# Patient Record
Sex: Male | Born: 1960 | Race: White | Hispanic: No | Marital: Married | Smoking: Never smoker
Health system: Southern US, Community
[De-identification: ages and names within clinical notes are randomized; demographics above are authoritative.]

---

## 2008-12-23 ENCOUNTER — Ambulatory Visit: Payer: Self-pay | Admitting: Diagnostic Radiology

## 2008-12-23 ENCOUNTER — Ambulatory Visit (HOSPITAL_BASED_OUTPATIENT_CLINIC_OR_DEPARTMENT_OTHER): Admission: RE | Admit: 2008-12-23 | Discharge: 2008-12-23 | Payer: Self-pay | Admitting: Family Medicine

## 2009-09-17 IMAGING — US US ABDOMEN COMPLETE
1 series · 14 of 14 positions shown · non-contrast
Comparison: None

CLINICAL DATA: Elevated bilirubin levels.

COMPLETE ABDOMINAL ULTRASOUND

[Series 1: us abdomen complete · 0.35mm/px · 14 of 14 slices shown]
[im 1/14]
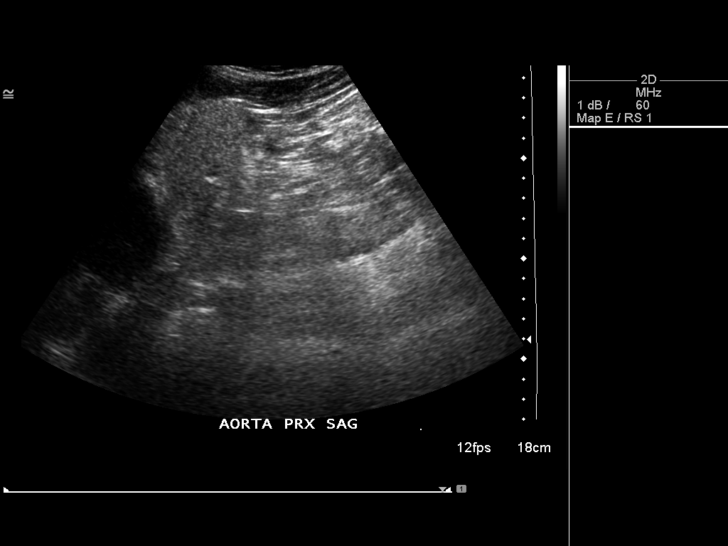
[im 2/14]
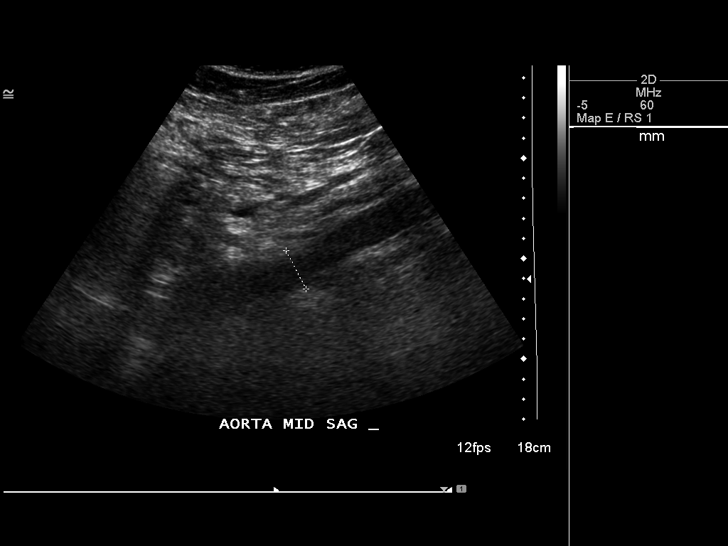
[im 3/14]
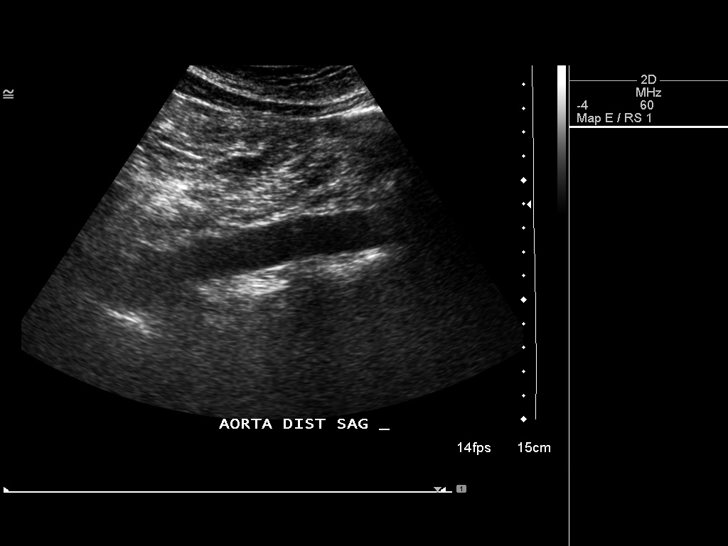
[im 4/14]
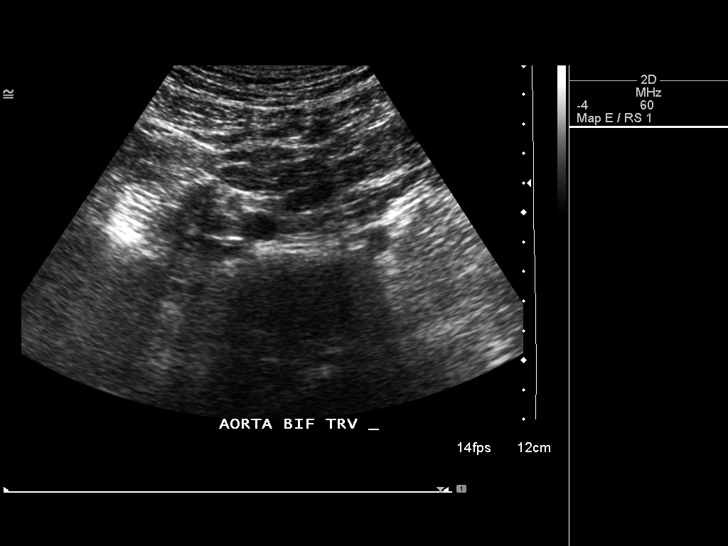
[im 5/14]
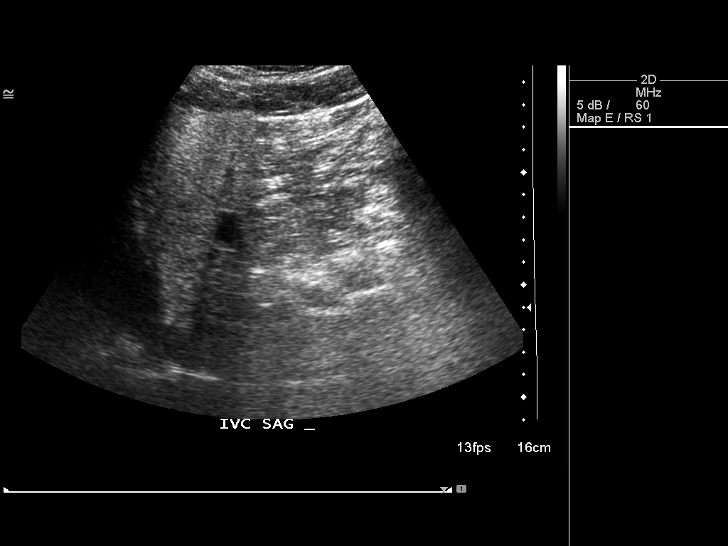
[im 6/14]
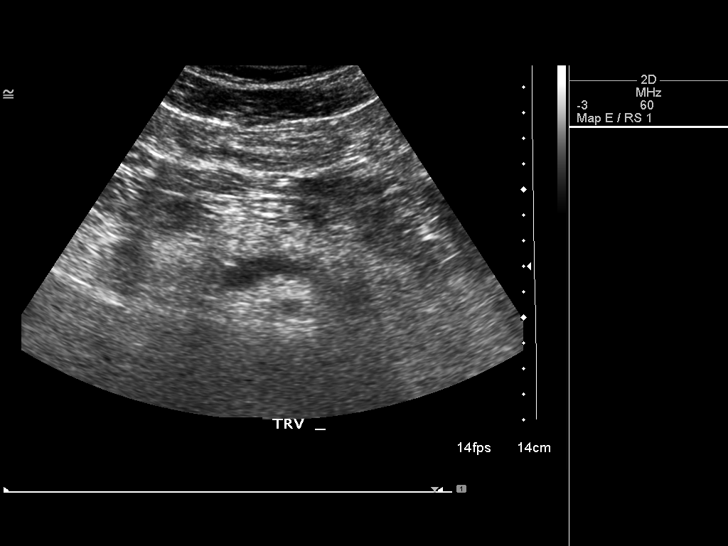
[im 7/14]
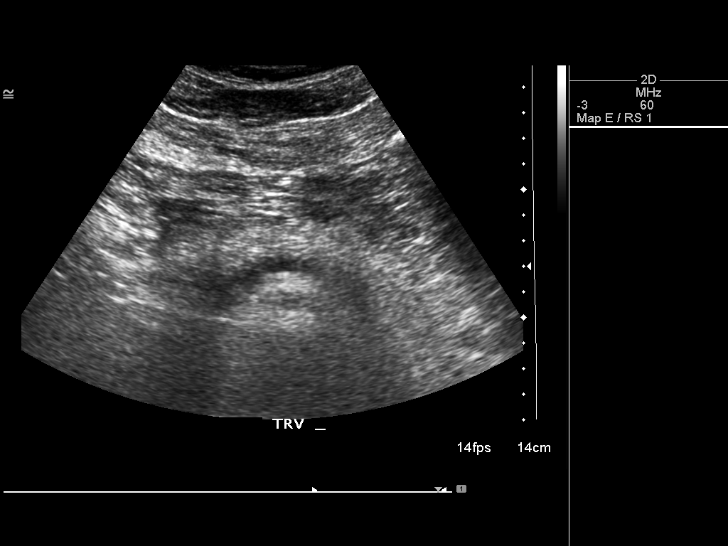
[im 8/14]
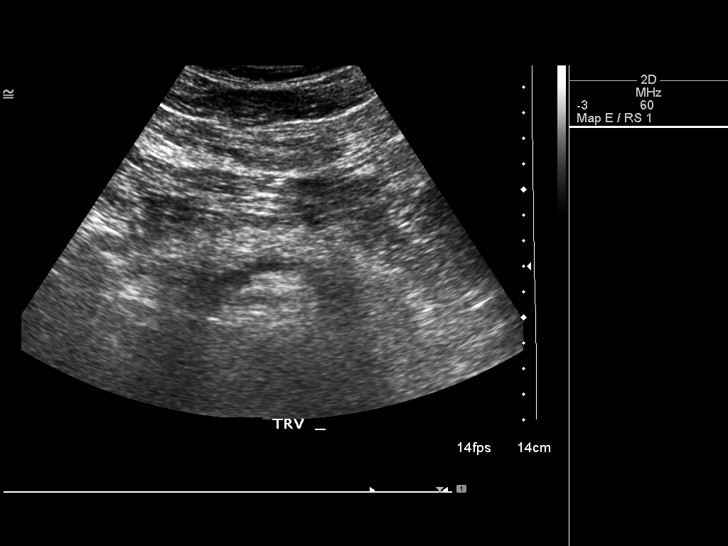
[im 9/14]
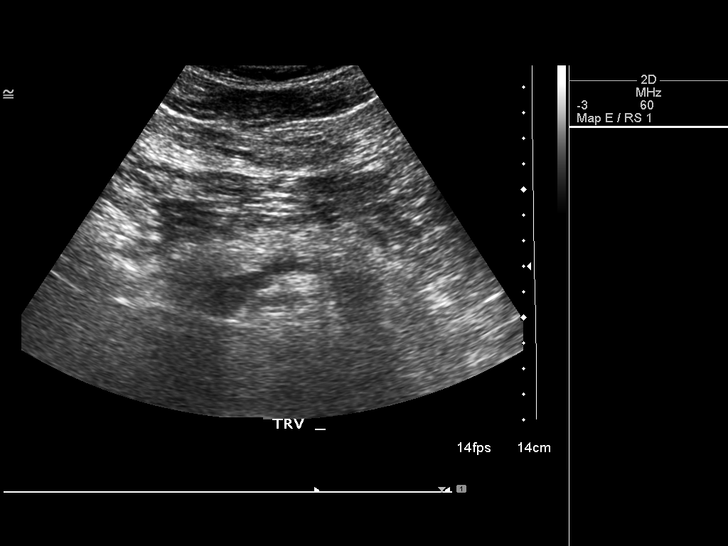
[im 10/14]
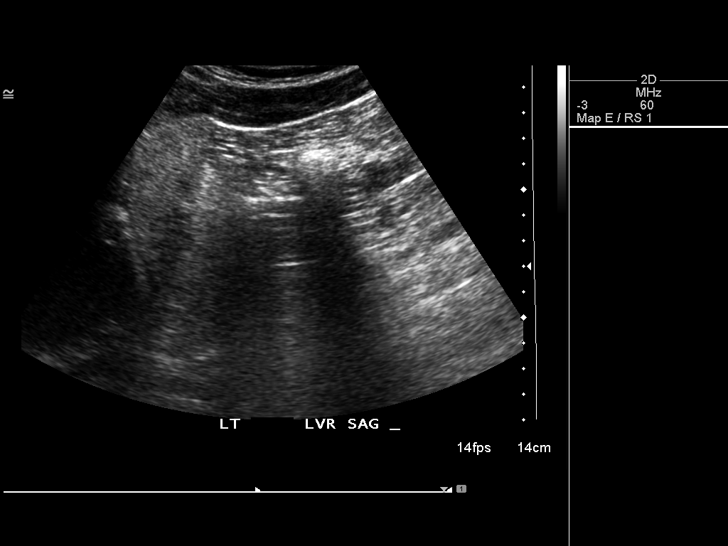
[im 11/14]
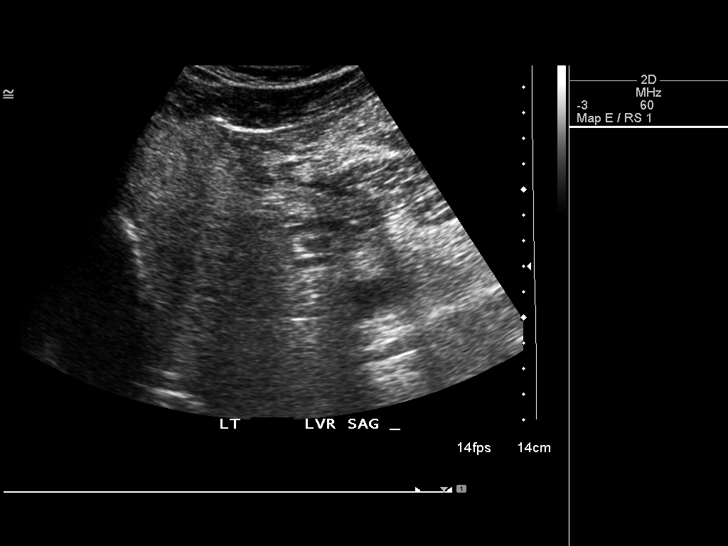
[im 12/14]
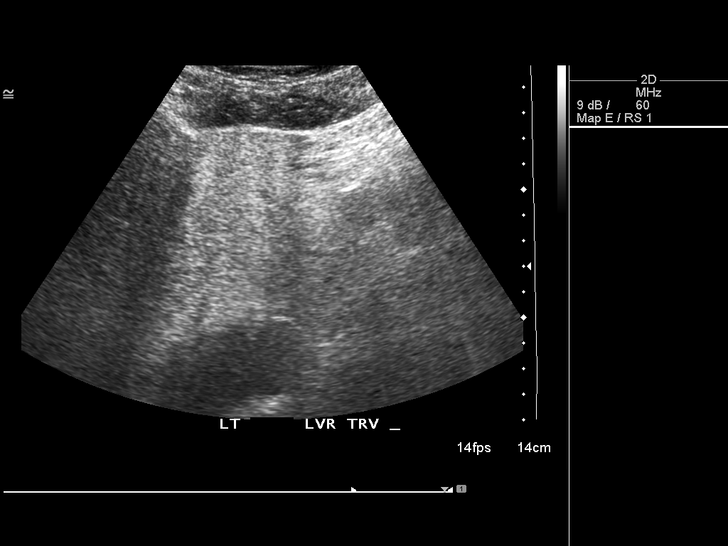
[im 13/14]
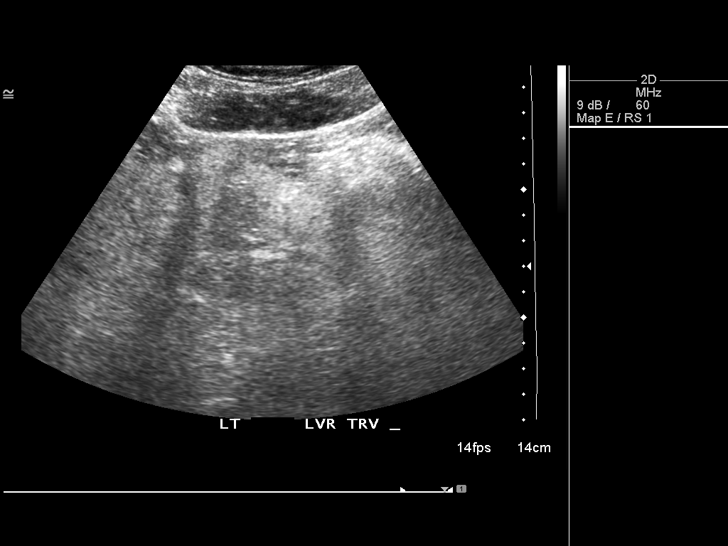
[im 14/14]
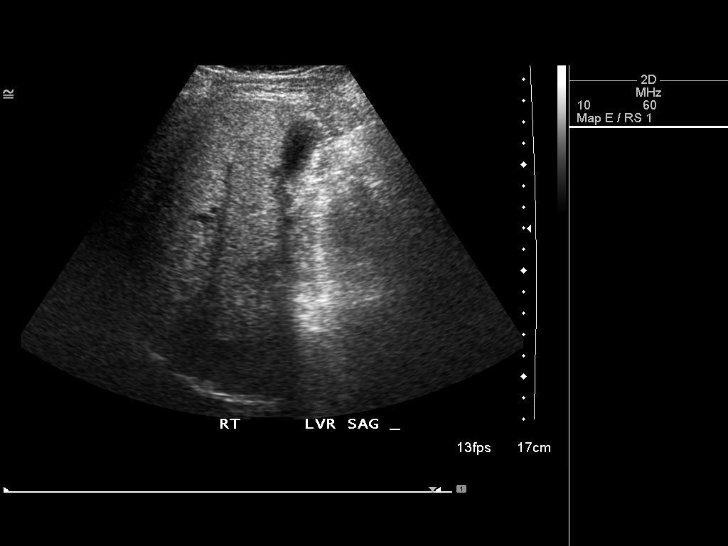

[14 of 14 positions shown; findings below may reference images not displayed]

FINDINGS: Gallbladder:  Marginally distended without wall thickening, stones
or pericholecystic fluid.

Common bile duct:  Normal in caliber without filling defects. Its
distal portion is not well visualized due to bowel gas.

Liver:  Unremarkable in echogenicity and contour without focal
abnormality.

IVC:  Visualized portions unremarkable. Portions are obscured by
bowel gas.

Pancreas:  Visualized portions unremarkable. Portions are obscured
by bowel gas.

Spleen:  Visualized portions unremarkable.

Right Kidney:  The renal cortical thickness and echogenicity are
preserved.  There is no hydronephrosis or focal abnormality.  Renal
length is 11.2 cm.

Left Kidney:  The renal cortical thickness and echogenicity are
preserved.  There is no hydronephrosis or focal abnormality.  Renal
length is 11.5cm.

Abdominal aorta:  Visualized portions unremarkable.  Portions are
obscured by bowel gas.
IMPRESSION: No acute or significant abdominal findings. There is no evidence of
biliary dilatation.  Portions of the abdomen are obscured by bowel
gas.

## 2016-07-11 ENCOUNTER — Emergency Department
Admission: EM | Admit: 2016-07-11 | Discharge: 2016-07-11 | Disposition: A | Discharge status: Home / Self Care | Payer: No Typology Code available for payment source | Source: Home / Self Care | Attending: Family Medicine | Admitting: Family Medicine

## 2016-07-11 ENCOUNTER — Encounter: Payer: Self-pay | Admitting: Emergency Medicine

## 2016-07-11 DIAGNOSIS — S61011A Laceration without foreign body of right thumb without damage to nail, initial encounter: Secondary | ICD-10-CM | POA: Diagnosis not present

## 2016-07-11 NOTE — ED Triage Notes (Signed)
Pt cut his right thumb on kitchen cabinet this am. States he is positive he is UTD on tetanus within the last 2 years. Mild pain. Denies numbness.

## 2016-07-11 NOTE — ED Provider Notes (Signed)
CSN: [id number]     Arrival date & time 07/11/16  1749 History   None    Chief Complaint  Patient presents with  . Extremity Laceration   (Consider location/radiation/quality/duration/timing/severity/associated sxs/prior Treatment) HPI  Stanley Acevedo is a 55 y.o. male presenting to UC with c/o laceration to his Right thumb that occurred around 10AM this morning. Pt cut it on a kitchen cabinet. He notes bleeding seemed to have stopped after he placed a bandage on it, however, when he tried changing the bandage this afternoon, his thumb would not stop bleeding. Pain is minimal. Denies being on blood thinners.  He is Right hand dominant. No other injuries.    History reviewed. No pertinent past medical history. History reviewed. No pertinent surgical history. History reviewed. No pertinent family history. Social History  Substance Use Topics  . Smoking status: Never Smoker  . Smokeless tobacco: Never Used  . Alcohol use No    Review of Systems  Musculoskeletal: Negative for arthralgias and joint swelling.  Skin: Positive for wound. Negative for color change.  Neurological: Negative for weakness and numbness.    Allergies  Patient has no allergy information on record.  Home Medications   Prior to Admission medications   Not on File   Meds Ordered and Administered this Visit  Medications - No data to display  BP 125/78 (BP Location: Right Arm)   Pulse 66   Temp 98.4 F (36.9 C) (Oral)   Wt 196 lb (88.9 kg)   SpO2 95%  No data found.   Physical Exam  Constitutional: He is oriented to person, place, and time. He appears well-developed and well-nourished.  HENT:  Head: Normocephalic and atraumatic.  Eyes: EOM are normal.  Neck: Normal range of motion.  Cardiovascular: Normal rate.   Pulmonary/Chest: Effort normal.  Musculoskeletal: Normal range of motion. He exhibits no edema or tenderness.  Right thumb: full ROM. Non-tender.  Neurological: He is alert and oriented  to person, place, and time.  Skin: Skin is warm and dry. Capillary refill takes less than 2 seconds.  Right thumb, dorsal proximal aspect: 0.5cm superficial laceration. Small amount of active bleeding easily stopped with direct pressure.   Psychiatric: He has a normal mood and affect. His behavior is normal.  Nursing note and vitals reviewed.   Urgent Care Course   Clinical Course     .Marland Kitchen.Laceration Repair Date/Time: 07/12/2016 9:55 AM Performed by: Junius Finner'MALLEY, Shinichi Anguiano Authorized by: Donna ChristenBEESE, STEPHEN A   Consent:    Consent obtained:  Verbal   Consent given by:  Patient   Risks discussed:  Infection, pain and poor cosmetic result   Alternatives discussed:  No treatment and delayed treatment Anesthesia (see MAR for exact dosages):    Anesthesia method:  None Laceration details:    Location:  Finger   Finger location:  R thumb   Length (cm):  0.5   Depth (mm):  1 Exploration:    Hemostasis achieved with:  Direct pressure   Wound exploration: wound explored through full range of motion and entire depth of wound probed and visualized     Wound extent: no areolar tissue violation noted, no fascia violation noted, no foreign bodies/material noted, no muscle damage noted, no nerve damage noted, no tendon damage noted, no underlying fracture noted and no vascular damage noted     Contaminated: no   Treatment:    Area cleansed with:  Hibiclens   Amount of cleaning:  Standard   Irrigation solution:  Sterile saline  Visualized foreign bodies/material removed: no   Skin repair:    Repair method:  Steri-Strips   Number of Steri-Strips:  3 Approximation:    Approximation:  Close Post-procedure details:    Dressing:  Non-adherent dressing and bulky dressing   Patient tolerance of procedure:  Tolerated well, no immediate complications   (including critical care time)  Labs Review Labs Reviewed - No data to display  Imaging Review No results found.    MDM   1. Laceration of right  thumb without foreign body without damage to nail, initial encounter    Superficial laceration to Right thumb. Steri-strips and pressure bandage placed w/o immediate complication. Home care instructions provided.  F/u with PCP or return to UC if needed.     Junius FinnerErin O'Malley, PA-C 07/12/16 [phone]

## 2021-09-25 DIAGNOSIS — M1612 Unilateral primary osteoarthritis, left hip: Secondary | ICD-10-CM | POA: Diagnosis not present

## 2021-10-07 DIAGNOSIS — M25552 Pain in left hip: Secondary | ICD-10-CM | POA: Diagnosis not present

## 2021-10-12 DIAGNOSIS — I10 Essential (primary) hypertension: Secondary | ICD-10-CM | POA: Diagnosis not present

## 2021-10-12 DIAGNOSIS — Z23 Encounter for immunization: Secondary | ICD-10-CM | POA: Diagnosis not present

## 2021-10-12 DIAGNOSIS — K219 Gastro-esophageal reflux disease without esophagitis: Secondary | ICD-10-CM | POA: Diagnosis not present

## 2021-10-12 DIAGNOSIS — M1612 Unilateral primary osteoarthritis, left hip: Secondary | ICD-10-CM | POA: Diagnosis not present

## 2021-10-12 DIAGNOSIS — Z0000 Encounter for general adult medical examination without abnormal findings: Secondary | ICD-10-CM | POA: Diagnosis not present

## 2021-10-12 DIAGNOSIS — Z125 Encounter for screening for malignant neoplasm of prostate: Secondary | ICD-10-CM | POA: Diagnosis not present

## 2021-10-12 DIAGNOSIS — Z13220 Encounter for screening for lipoid disorders: Secondary | ICD-10-CM | POA: Diagnosis not present

## 2021-10-12 DIAGNOSIS — J4520 Mild intermittent asthma, uncomplicated: Secondary | ICD-10-CM | POA: Diagnosis not present

## 2021-10-25 DIAGNOSIS — U071 COVID-19: Secondary | ICD-10-CM | POA: Diagnosis not present

## 2021-10-26 DIAGNOSIS — Z1211 Encounter for screening for malignant neoplasm of colon: Secondary | ICD-10-CM | POA: Diagnosis not present

## 2021-11-09 DIAGNOSIS — R7301 Impaired fasting glucose: Secondary | ICD-10-CM | POA: Diagnosis not present

## 2021-11-09 DIAGNOSIS — I10 Essential (primary) hypertension: Secondary | ICD-10-CM | POA: Diagnosis not present

## 2021-12-11 DIAGNOSIS — I10 Essential (primary) hypertension: Secondary | ICD-10-CM | POA: Diagnosis not present

## 2021-12-11 DIAGNOSIS — R7303 Prediabetes: Secondary | ICD-10-CM | POA: Diagnosis not present

## 2022-02-03 DIAGNOSIS — M25552 Pain in left hip: Secondary | ICD-10-CM | POA: Diagnosis not present

## 2022-02-16 DIAGNOSIS — L821 Other seborrheic keratosis: Secondary | ICD-10-CM | POA: Diagnosis not present

## 2022-02-16 DIAGNOSIS — L570 Actinic keratosis: Secondary | ICD-10-CM | POA: Diagnosis not present

## 2022-02-16 DIAGNOSIS — M1612 Unilateral primary osteoarthritis, left hip: Secondary | ICD-10-CM | POA: Diagnosis not present

## 2022-02-16 DIAGNOSIS — D225 Melanocytic nevi of trunk: Secondary | ICD-10-CM | POA: Diagnosis not present

## 2022-02-16 DIAGNOSIS — Z85820 Personal history of malignant melanoma of skin: Secondary | ICD-10-CM | POA: Diagnosis not present

## 2022-02-16 DIAGNOSIS — L814 Other melanin hyperpigmentation: Secondary | ICD-10-CM | POA: Diagnosis not present

## 2022-04-21 DIAGNOSIS — M1612 Unilateral primary osteoarthritis, left hip: Secondary | ICD-10-CM | POA: Diagnosis not present

## 2022-06-07 DIAGNOSIS — L309 Dermatitis, unspecified: Secondary | ICD-10-CM | POA: Diagnosis not present

## 2022-06-07 DIAGNOSIS — E7800 Pure hypercholesterolemia, unspecified: Secondary | ICD-10-CM | POA: Diagnosis not present

## 2022-06-07 DIAGNOSIS — R7303 Prediabetes: Secondary | ICD-10-CM | POA: Diagnosis not present

## 2022-06-07 DIAGNOSIS — I10 Essential (primary) hypertension: Secondary | ICD-10-CM | POA: Diagnosis not present

## 2022-06-14 DIAGNOSIS — M1612 Unilateral primary osteoarthritis, left hip: Secondary | ICD-10-CM | POA: Diagnosis not present

## 2022-08-19 DIAGNOSIS — M1612 Unilateral primary osteoarthritis, left hip: Secondary | ICD-10-CM | POA: Diagnosis not present

## 2022-08-27 DIAGNOSIS — M1612 Unilateral primary osteoarthritis, left hip: Secondary | ICD-10-CM | POA: Diagnosis not present

## 2022-12-07 DIAGNOSIS — Z85820 Personal history of malignant melanoma of skin: Secondary | ICD-10-CM | POA: Diagnosis not present

## 2022-12-07 DIAGNOSIS — L821 Other seborrheic keratosis: Secondary | ICD-10-CM | POA: Diagnosis not present

## 2022-12-07 DIAGNOSIS — L570 Actinic keratosis: Secondary | ICD-10-CM | POA: Diagnosis not present

## 2022-12-07 DIAGNOSIS — L814 Other melanin hyperpigmentation: Secondary | ICD-10-CM | POA: Diagnosis not present

## 2022-12-07 DIAGNOSIS — L738 Other specified follicular disorders: Secondary | ICD-10-CM | POA: Diagnosis not present

## 2022-12-13 DIAGNOSIS — K219 Gastro-esophageal reflux disease without esophagitis: Secondary | ICD-10-CM | POA: Diagnosis not present

## 2022-12-13 DIAGNOSIS — R7303 Prediabetes: Secondary | ICD-10-CM | POA: Diagnosis not present

## 2022-12-13 DIAGNOSIS — I10 Essential (primary) hypertension: Secondary | ICD-10-CM | POA: Diagnosis not present

## 2022-12-13 DIAGNOSIS — E7800 Pure hypercholesterolemia, unspecified: Secondary | ICD-10-CM | POA: Diagnosis not present

## 2023-04-11 DIAGNOSIS — Z96642 Presence of left artificial hip joint: Secondary | ICD-10-CM | POA: Diagnosis not present

## 2023-06-13 DIAGNOSIS — Z1211 Encounter for screening for malignant neoplasm of colon: Secondary | ICD-10-CM | POA: Diagnosis not present

## 2023-06-13 DIAGNOSIS — I10 Essential (primary) hypertension: Secondary | ICD-10-CM | POA: Diagnosis not present

## 2023-06-13 DIAGNOSIS — R7303 Prediabetes: Secondary | ICD-10-CM | POA: Diagnosis not present

## 2023-06-13 DIAGNOSIS — K219 Gastro-esophageal reflux disease without esophagitis: Secondary | ICD-10-CM | POA: Diagnosis not present

## 2023-06-13 DIAGNOSIS — E7800 Pure hypercholesterolemia, unspecified: Secondary | ICD-10-CM | POA: Diagnosis not present

## 2023-06-14 ENCOUNTER — Other Ambulatory Visit: Payer: Self-pay | Admitting: Family Medicine

## 2023-06-14 DIAGNOSIS — E7800 Pure hypercholesterolemia, unspecified: Secondary | ICD-10-CM

## 2023-06-16 ENCOUNTER — Ambulatory Visit (INDEPENDENT_AMBULATORY_CARE_PROVIDER_SITE_OTHER): Payer: Self-pay

## 2023-06-16 DIAGNOSIS — E7800 Pure hypercholesterolemia, unspecified: Secondary | ICD-10-CM

## 2023-06-16 DIAGNOSIS — Z1211 Encounter for screening for malignant neoplasm of colon: Secondary | ICD-10-CM | POA: Diagnosis not present

## 2023-07-04 DIAGNOSIS — M720 Palmar fascial fibromatosis [Dupuytren]: Secondary | ICD-10-CM | POA: Diagnosis not present

## 2023-07-20 DIAGNOSIS — M720 Palmar fascial fibromatosis [Dupuytren]: Secondary | ICD-10-CM | POA: Diagnosis not present

## 2023-08-31 DIAGNOSIS — J45901 Unspecified asthma with (acute) exacerbation: Secondary | ICD-10-CM | POA: Diagnosis not present

## 2023-09-13 DIAGNOSIS — Z96642 Presence of left artificial hip joint: Secondary | ICD-10-CM | POA: Diagnosis not present

## 2023-09-13 DIAGNOSIS — Z471 Aftercare following joint replacement surgery: Secondary | ICD-10-CM | POA: Diagnosis not present

## 2023-09-21 DIAGNOSIS — M720 Palmar fascial fibromatosis [Dupuytren]: Secondary | ICD-10-CM | POA: Diagnosis not present

## 2023-09-23 DIAGNOSIS — M25641 Stiffness of right hand, not elsewhere classified: Secondary | ICD-10-CM | POA: Diagnosis not present

## 2023-09-23 DIAGNOSIS — M720 Palmar fascial fibromatosis [Dupuytren]: Secondary | ICD-10-CM | POA: Diagnosis not present

## 2023-10-04 DIAGNOSIS — L821 Other seborrheic keratosis: Secondary | ICD-10-CM | POA: Diagnosis not present

## 2023-10-04 DIAGNOSIS — D225 Melanocytic nevi of trunk: Secondary | ICD-10-CM | POA: Diagnosis not present

## 2023-10-04 DIAGNOSIS — L570 Actinic keratosis: Secondary | ICD-10-CM | POA: Diagnosis not present

## 2023-10-04 DIAGNOSIS — L814 Other melanin hyperpigmentation: Secondary | ICD-10-CM | POA: Diagnosis not present

## 2023-10-04 DIAGNOSIS — Z85820 Personal history of malignant melanoma of skin: Secondary | ICD-10-CM | POA: Diagnosis not present

## 2023-12-12 DIAGNOSIS — I10 Essential (primary) hypertension: Secondary | ICD-10-CM | POA: Diagnosis not present

## 2023-12-12 DIAGNOSIS — K219 Gastro-esophageal reflux disease without esophagitis: Secondary | ICD-10-CM | POA: Diagnosis not present

## 2023-12-12 DIAGNOSIS — J4520 Mild intermittent asthma, uncomplicated: Secondary | ICD-10-CM | POA: Diagnosis not present

## 2023-12-12 DIAGNOSIS — M25552 Pain in left hip: Secondary | ICD-10-CM | POA: Diagnosis not present

## 2024-07-26 DIAGNOSIS — J4520 Mild intermittent asthma, uncomplicated: Secondary | ICD-10-CM | POA: Diagnosis not present

## 2024-07-26 DIAGNOSIS — Z0000 Encounter for general adult medical examination without abnormal findings: Secondary | ICD-10-CM | POA: Diagnosis not present

## 2024-07-26 DIAGNOSIS — Z23 Encounter for immunization: Secondary | ICD-10-CM | POA: Diagnosis not present

## 2024-07-26 DIAGNOSIS — R7303 Prediabetes: Secondary | ICD-10-CM | POA: Diagnosis not present

## 2024-07-26 DIAGNOSIS — I10 Essential (primary) hypertension: Secondary | ICD-10-CM | POA: Diagnosis not present

## 2024-07-26 DIAGNOSIS — K219 Gastro-esophageal reflux disease without esophagitis: Secondary | ICD-10-CM | POA: Diagnosis not present

## 2024-07-26 DIAGNOSIS — E7800 Pure hypercholesterolemia, unspecified: Secondary | ICD-10-CM | POA: Diagnosis not present
